# Patient Record
Sex: Female | Born: 1960 | Race: Black or African American | Hispanic: No | Marital: Married | Smoking: Never smoker
Health system: Southern US, Community
[De-identification: ages and names within clinical notes are randomized; demographics above are authoritative.]

## PROBLEM LIST (undated history)

## (undated) DIAGNOSIS — K219 Gastro-esophageal reflux disease without esophagitis: Secondary | ICD-10-CM

## (undated) DIAGNOSIS — I10 Essential (primary) hypertension: Secondary | ICD-10-CM

## (undated) DIAGNOSIS — D219 Benign neoplasm of connective and other soft tissue, unspecified: Secondary | ICD-10-CM

## (undated) DIAGNOSIS — IMO0001 Reserved for inherently not codable concepts without codable children: Secondary | ICD-10-CM

## (undated) HISTORY — PX: ABLATION: SHX5711

## (undated) HISTORY — DX: Benign neoplasm of connective and other soft tissue, unspecified: D21.9

## (undated) HISTORY — DX: Gastro-esophageal reflux disease without esophagitis: K21.9

## (undated) HISTORY — DX: Reserved for inherently not codable concepts without codable children: IMO0001

---

## 2001-12-14 ENCOUNTER — Other Ambulatory Visit: Admission: RE | Admit: 2001-12-14 | Discharge: 2001-12-14 | Payer: Self-pay | Admitting: Obstetrics and Gynecology

## 2002-07-02 ENCOUNTER — Ambulatory Visit (HOSPITAL_COMMUNITY): Admission: RE | Admit: 2002-07-02 | Discharge: 2002-07-02 | Payer: Self-pay | Admitting: Family Medicine

## 2002-12-24 ENCOUNTER — Other Ambulatory Visit: Admission: RE | Admit: 2002-12-24 | Discharge: 2002-12-24 | Payer: Self-pay | Admitting: Obstetrics and Gynecology

## 2003-04-24 ENCOUNTER — Encounter: Admission: RE | Admit: 2003-04-24 | Discharge: 2003-04-24 | Payer: Self-pay | Admitting: Cardiology

## 2003-04-24 ENCOUNTER — Encounter: Payer: Self-pay | Admitting: Cardiology

## 2003-07-15 ENCOUNTER — Ambulatory Visit (HOSPITAL_COMMUNITY): Admission: RE | Admit: 2003-07-15 | Discharge: 2003-07-15 | Payer: Self-pay | Admitting: Cardiology

## 2003-07-15 ENCOUNTER — Encounter: Payer: Self-pay | Admitting: Cardiology

## 2005-05-20 ENCOUNTER — Ambulatory Visit (HOSPITAL_COMMUNITY): Admission: RE | Admit: 2005-05-20 | Discharge: 2005-05-20 | Payer: Self-pay | Admitting: Cardiology

## 2005-05-26 ENCOUNTER — Encounter: Admission: RE | Admit: 2005-05-26 | Discharge: 2005-05-26 | Payer: Self-pay | Admitting: Cardiology

## 2006-02-21 ENCOUNTER — Emergency Department (HOSPITAL_COMMUNITY): Admission: EM | Admit: 2006-02-21 | Discharge: 2006-02-21 | Payer: Self-pay | Admitting: Family Medicine

## 2006-06-01 ENCOUNTER — Ambulatory Visit (HOSPITAL_COMMUNITY): Admission: RE | Admit: 2006-06-01 | Discharge: 2006-06-01 | Payer: Self-pay | Admitting: Cardiology

## 2008-02-05 ENCOUNTER — Emergency Department (HOSPITAL_COMMUNITY): Admission: EM | Admit: 2008-02-05 | Discharge: 2008-02-05 | Payer: Self-pay | Admitting: Emergency Medicine

## 2008-04-10 ENCOUNTER — Ambulatory Visit: Payer: Self-pay | Admitting: Gynecology

## 2008-04-10 ENCOUNTER — Other Ambulatory Visit: Admission: RE | Admit: 2008-04-10 | Discharge: 2008-04-10 | Payer: Self-pay | Admitting: Obstetrics and Gynecology

## 2008-04-24 ENCOUNTER — Ambulatory Visit: Payer: Self-pay | Admitting: Obstetrics & Gynecology

## 2008-05-01 ENCOUNTER — Ambulatory Visit: Payer: Self-pay | Admitting: Obstetrics & Gynecology

## 2008-05-30 ENCOUNTER — Ambulatory Visit: Payer: Self-pay | Admitting: Obstetrics and Gynecology

## 2008-06-03 ENCOUNTER — Ambulatory Visit (HOSPITAL_COMMUNITY): Admission: RE | Admit: 2008-06-03 | Discharge: 2008-06-03 | Payer: Self-pay | Admitting: Cardiology

## 2008-06-23 ENCOUNTER — Inpatient Hospital Stay (HOSPITAL_COMMUNITY): Admission: EM | Admit: 2008-06-23 | Discharge: 2008-06-24 | Payer: Self-pay | Admitting: Emergency Medicine

## 2008-06-27 ENCOUNTER — Ambulatory Visit: Payer: Self-pay | Admitting: Obstetrics & Gynecology

## 2008-08-01 ENCOUNTER — Ambulatory Visit: Payer: Self-pay | Admitting: Obstetrics & Gynecology

## 2008-08-13 ENCOUNTER — Ambulatory Visit: Payer: Self-pay | Admitting: Obstetrics & Gynecology

## 2008-08-29 ENCOUNTER — Ambulatory Visit: Payer: Self-pay | Admitting: Obstetrics & Gynecology

## 2008-09-26 ENCOUNTER — Ambulatory Visit: Payer: Self-pay | Admitting: Obstetrics & Gynecology

## 2008-10-14 ENCOUNTER — Ambulatory Visit (HOSPITAL_COMMUNITY): Admission: RE | Admit: 2008-10-14 | Discharge: 2008-10-14 | Payer: Self-pay | Admitting: Obstetrics & Gynecology

## 2008-10-14 ENCOUNTER — Ambulatory Visit: Payer: Self-pay | Admitting: Obstetrics & Gynecology

## 2008-10-14 ENCOUNTER — Encounter: Payer: Self-pay | Admitting: Obstetrics & Gynecology

## 2008-10-24 ENCOUNTER — Ambulatory Visit: Payer: Self-pay | Admitting: Obstetrics & Gynecology

## 2009-01-19 ENCOUNTER — Emergency Department (HOSPITAL_COMMUNITY): Admission: EM | Admit: 2009-01-19 | Discharge: 2009-01-19 | Payer: Self-pay | Admitting: Emergency Medicine

## 2009-07-03 ENCOUNTER — Emergency Department (HOSPITAL_COMMUNITY): Admission: EM | Admit: 2009-07-03 | Discharge: 2009-07-03 | Payer: Self-pay | Admitting: Emergency Medicine

## 2009-08-31 IMAGING — CR DG CHEST 1V PORT
1 series · 1 of 1 positions shown · non-contrast
Comparison: None

CLINICAL DATA: Chest pain.  Short of breath.

PORTABLE CHEST - 1 VIEW

[view not recorded]
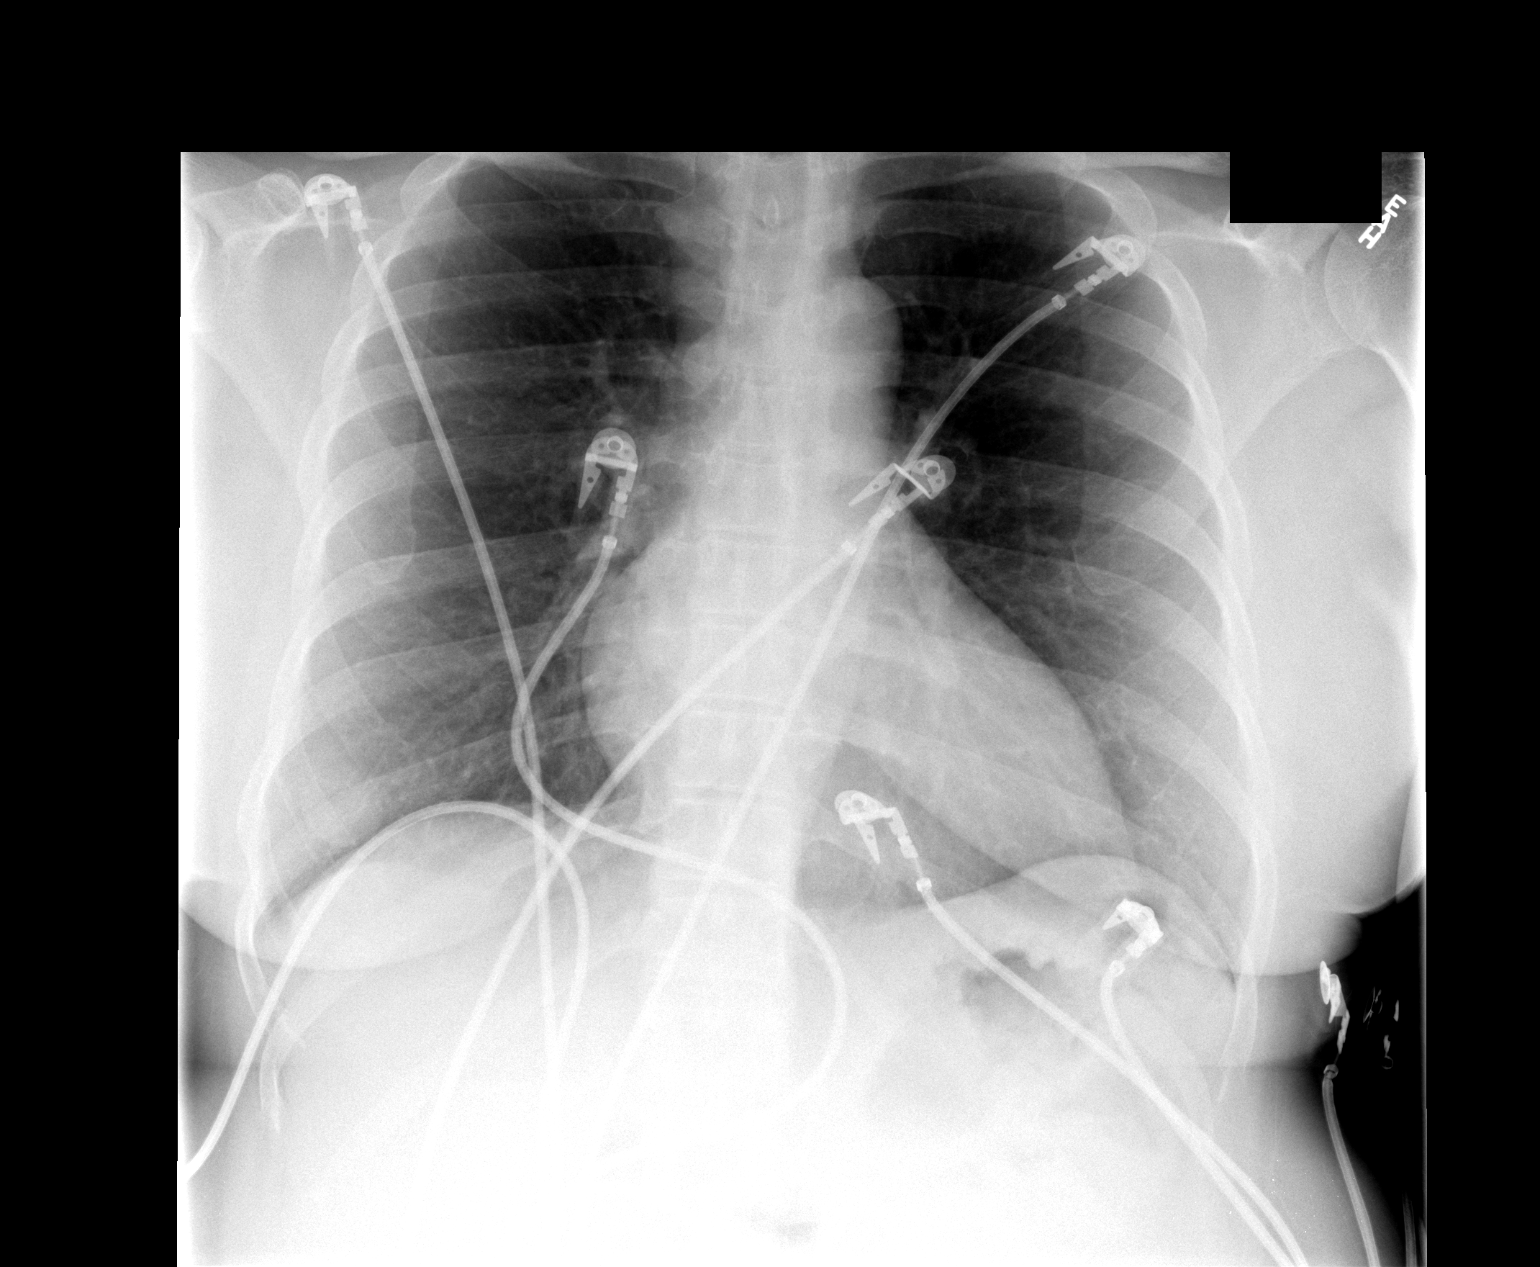

[1 of 1 positions shown; findings below may reference images not displayed]

FINDINGS: Artifact overlies the chest.  Heart size is normal.  The
vascularity is normal.  Lungs are clear.  No effusions.
IMPRESSION: No active disease

## 2009-12-03 ENCOUNTER — Ambulatory Visit (HOSPITAL_COMMUNITY): Admission: RE | Admit: 2009-12-03 | Discharge: 2009-12-03 | Payer: Self-pay | Admitting: Cardiology

## 2010-12-23 ENCOUNTER — Ambulatory Visit (HOSPITAL_COMMUNITY)
Admission: RE | Admit: 2010-12-23 | Discharge: 2010-12-23 | Payer: Self-pay | Source: Home / Self Care | Attending: Cardiology | Admitting: Cardiology

## 2011-04-26 NOTE — Group Therapy Note (Signed)
NAMEMYLIN, GIGNAC NO.:  [id number]   MEDICAL RECORD NO.:  [id number]          PATIENT TYPE:  WOC   LOCATION:  [facility]                   FACILITY:  [facility]   PHYSICIAN:  Elsie Lincoln, MD      DATE OF BIRTH:  1961-08-03   DATE OF SERVICE:  08/13/2008                                  CLINIC NOTE   The patient is a 50 year old nulliparous female with history of  menorrhagia with severe anemia.  The patient has had a negative  endometrial biopsy this year on April 10, 2008.  She has been placed on  Depo Lupron for 6 months.  She still is bleeding, sometimes spotting and  sometimes heavy due to Depo Lupron.  We discussed an IUD, continue  Lupron with antibiotics therapy, endometrial ablation and hysterectomy.  The patient opted for endometrial ablation.  We will schedule this.  She  needs to meet with financial assistance to determine sliding scale and  payment schedule.  This is a medically necessary procedure.  She has  failed medical management.  We have done a complete history in April  with major medical problems including hypertension.  The patient is to  continue her Depo Lupron until the surgery and we will do a D&C  hysteroscopy, hydrothermal ablation.  The patient was consented for  procedure with the risks include but not limited to bleeding, infection,  damage to the back of the uterus and also burning of the vagina.  The  patient will be scheduled at earliest possible date.           ______________________________  Elsie Lincoln, MD     KL/MEDQ  D:  08/13/2008  T:  08/13/2008  Job:  [id number]

## 2011-04-26 NOTE — Group Therapy Note (Signed)
Laura Oneal, Laura Oneal NO.:  [id number]   MEDICAL RECORD NO.:  [id number]          PATIENT TYPE:  WOC   LOCATION:  [facility]                   FACILITY:  [facility]   PHYSICIAN:  Elsie Lincoln, MD      DATE OF BIRTH:  08-16-1961   DATE OF SERVICE:  10/24/2008                                  CLINIC NOTE   The patient is a 50 year old female who underwent D&C, hysteroscopy, and  hydrothermal ablation approximately 2 weeks ago on October 14, 2008.  The patient has had no bleeding, minimal discharge, and is very happy  with the procedure.  She had no pain and can resume sexual intercourse  at this time.  The patient understands that there is a risk that she may  develop periods later.  However, they will not be as heavy as they were.  If they do continue, then we could possibly go back on Lupron, use Depo-  Provera, or even possibly hysterectomy as necessary.   PHYSICAL EXAMINATION:  VITAL SIGNS:  Temperature 97.2, pulse 70, blood  pressure 147/95 with a repeat of 125/79, weight 184.2, and height 5 feet  1-1/2 inches.  GENERAL:  Well nourished, well developed in no apparent distress.  HEENT:  Normocephalic and atraumatic.  ABDOMEN:  Soft and nontender.  No rebound or guarding.  GENITALIA:  Tanner V.  Vagina pink, normal rugae.  Cervix is closed,  nontender.  Uterus is slightly enlarged, midline, nontender. Adnexa, no  masses and nontender.   ASSESSMENT/PLAN:  A 50 year old female status post dilation and  curettage, hysteroscopy, and hydrothermal ablation, doing well.  The  patient is to come back in January for yearly exam.           ______________________________  Elsie Lincoln, MD     KL/MEDQ  D:  10/24/2008  T:  10/25/2008  Job:  [id number]

## 2011-04-26 NOTE — Op Note (Signed)
Laura Oneal, Laura Oneal              ACCOUNT NO.:  [id number]   MEDICAL RECORD NO.:  [id number]          PATIENT TYPE:  AMB   LOCATION:  [facility]                           [facility]   PHYSICIAN:  Lesly Dukes, M.D. DATE OF BIRTH:  1961-01-19   DATE OF PROCEDURE:  10/14/2008  DATE OF DISCHARGE:  10/14/2008                               OPERATIVE REPORT   PREOPERATIVE DIAGNOSES:  A 50 year old female with menorrhagia, fibroid,  and iron-deficiency anemia.   POSTOPERATIVE DIAGNOSES:  A 50 year old female with menorrhagia,  fibroid, and iron-deficiency anemia.   PROCEDURES:  Dilation and curettage, hysteroscopy, and hydrothermal  ablation.   SURGEON:  Lesly Dukes, MD   ANESTHESIA:  General.   FINDINGS:  Slightly enlarged uterus with a small mucosal fibroid that  was well dilated.   SPECIMENS:  Endometrial curettings to Pathology.   ESTIMATED BLOOD LOSS:  Minimal.   COMPLICATIONS:  None.   PROCEDURE IN DETAIL:  After informed consent was obtained, the patient  was taken to the operating room where general anesthesia was induced.  The patient was placed in a dorsal lithotomy position.  The bladder was  emptied.  The speculum was placed in the vagina.  The anterior lip of  cervix was grasped with a single-tooth tenaculum.  The cervical os was  dilated to a #8 Hegar.  Sharp curettage was performed and these products  were sent to Pathology.  The hysteroscope was introduced and then using  manufacturer's specifications, the endometrium was ablated for 10  minutes with an appropriate 1 minute cool down.  Findings were noted as  above.  All instruments were then removed from the patient's vagina and  there was good hemostasis at the cervix.  The patient tolerated the  procedure well.  The sponge, lap, instrument, and needle count were  correct x2, and the patient left the room in stable condition.      Lesly Dukes, M.D.  Electronically Signed     KHL/MEDQ  D:   10/23/2008  T:  10/24/2008  Job:  [id number]

## 2011-04-26 NOTE — Group Therapy Note (Signed)
Laura Oneal, Laura Oneal NO.:  [id number]   MEDICAL RECORD NO.:  [id number]          PATIENT TYPE:  WOC   LOCATION:  [facility]                   [facility]ACILITY:  [facility]   PHYSICIAN:  Ginger Carne, MD DATE OF BIRTH:  03/24/1961   DATE OF SERVICE:  04/10/2008                                  CLINIC NOTE   REASON FOR VISIT:  Abnormal uterine bleeding with iron-deficiency  anemia.   HISTORY OF PRESENT ILLNESS:  This is a 50 year old G0 who presents at  the referral of [facility] for further evaluation of abnormal  uterine bleeding with significant iron-deficiency anemia.  The patient  reports that over the last 6 months she has developed heavy bleeding  with her periods and clotting and has developed iron-deficiency anemia  as a result with a hemoglobin as low as 7.4 in March.  The patient is  currently on iron and is asymptomatic.  The patient was placed on  DepoLupron and has received 2 total injections, one on February 28, 2008  and a second on March 30, 2008 to try to decrease the bleeding.  She had  pelvic ultrasounds performed at [facility] which revealed a fibroid  uterus and thus, she was referred here for endometrial biopsy.  The  patient does report improvement in her bleeding since receiving the  DepoLupron injections but has not had resolution of her bleeding.   REVIEW OF SYSTEMS:  A 10-system review of systems otherwise negative  except for hot flashes.   PAST MEDICAL HISTORY:  Significant only for:  1. Hypertension.  2. Iron-deficiency anemia.  3. Fibroid uterus, as mentioned above.   OBSTETRICAL/GYNECOLOGIC HISTORY:  The patient is a G0.  She had onset of  menarche at age 72 with regular periods approximately every 30 days.  She has no intermenstrual bleeding.  Her last Pap smear was in January  2009 and was normal.  She has no history of abnormal Pap smears.  Last  mammogram was in May 2007 and normal.   FAMILY HISTORY:  Significant for  diabetes, heart disease, and high blood  pressure.   SOCIAL HISTORY:  The patient denies tobacco, alcohol, or drug use.   PHYSICAL EXAMINATION:  VITAL SIGNS:  Temperature is 97, heart rate 67,  blood pressure 144/88, weight is 175.5 pounds, height is 5 feet 1.5  inches, respiratory rate 18.  GENERAL:  This is a well nourished well developed female in no acute  distress.  HEART:  Regular rate and rhythm.  LUNGS:  Normal work-of-breathing.  ABDOMEN:  Soft and nontender.  GENITOURINARY:  The patient has normal external female genitalia.  Vaginal mucosa is pink and moist without lesions.  Cervix was well  visualized with a groove speculum and slightly deviated to the patient's  left.  The patient has small less than 5-mm  cyst on the inferior aspect  of the cervix at approximately 7 o'clock.  The cervix is otherwise  normal without lesions or discharge.  Uterus was sounded to  approximately 9.5-cm and the __________ endometrial biopsy was obtained  with adequate tissue specimen.  The patient tolerated the procedure well  with minimal blood loss.  Bimanual exam revealed a 10 to 12-week size  nontender uterus.   ASSESSMENT:  This is a 50 year old gravida 0 female with abnormal  uterine bleeding and iron-deficiency anemia as well as uterine fibroids  who presents at the referral of [facility] for  an endometrial biopsy.   PLAN:  1. Abnormal uterine bleeding.  It may be secondary to uterine      fibroids, however, given age endometrial biopsy is indicated to      rule out cancer and was obtained today successfully.  The patient      will return in 2 weeks to discuss pathology results.  2. Iron-deficiency anemia.  Baseline hemoglobin is between 7-8.      Currently the patient is on iron therapy taking at least twice      daily without problems.  She reports compliance with this.  She is      currently asymptomatic.  3. Uterine fibroids.  This may be the source of  the patient's      bleeding, however, will need endometrial biopsy results before      proceeding with additional treatment.   FOLLOWUP APPOINTMENT:  The patient is to follow up in 2 weeks for  results of endometrial biopsy.     ______________________________  Drue Dun, Dr.    ______________________________  Ginger Carne, MD    EE/MEDQ  D:  04/10/2008  T:  04/10/2008  Job:  [id number]

## 2011-08-27 ENCOUNTER — Inpatient Hospital Stay (INDEPENDENT_AMBULATORY_CARE_PROVIDER_SITE_OTHER)
Admission: RE | Admit: 2011-08-27 | Discharge: 2011-08-27 | Disposition: A | Discharge status: Home / Self Care | Payer: Managed Care, Other (non HMO) | Source: Ambulatory Visit | Attending: Family Medicine | Admitting: Family Medicine

## 2011-08-27 DIAGNOSIS — J019 Acute sinusitis, unspecified: Secondary | ICD-10-CM

## 2011-09-08 LAB — POCT I-STAT, CHEM 8
BUN: 9
Chloride: 101
Creatinine, Ser: 0.9
Hemoglobin: 13.6
Potassium: 3.4 — ABNORMAL LOW

## 2011-09-08 LAB — COMPREHENSIVE METABOLIC PANEL
AST: 18
Albumin: 3.4 — ABNORMAL LOW
Alkaline Phosphatase: 63
CO2: 32
Calcium: 9.2
Chloride: 101
GFR calc Af Amer: >60
Glucose, Bld: 90
Sodium: 139
Total Bilirubin: 0.9

## 2011-09-08 LAB — TROPONIN I: Troponin I: <0.01

## 2011-09-08 LAB — CBC
HCT: 37.6
Platelets: 319
RDW: 13.3
WBC: 4.8

## 2011-09-08 LAB — CK TOTAL AND CKMB (NOT AT ARMC)
Relative Index: 1.3
Relative Index: 1.4

## 2011-09-08 LAB — POCT CARDIAC MARKERS
CKMB, poc: 2.2
Myoglobin, poc: 57.1
Operator id: 151321
Troponin i, poc: <0.05

## 2011-09-13 LAB — CBC
MCHC: 32.8
MCV: 83.5
Platelets: 300
RDW: 13.5

## 2012-01-09 ENCOUNTER — Other Ambulatory Visit (HOSPITAL_COMMUNITY): Payer: Self-pay | Admitting: Cardiology

## 2012-01-09 DIAGNOSIS — Z1231 Encounter for screening mammogram for malignant neoplasm of breast: Secondary | ICD-10-CM

## 2012-02-07 ENCOUNTER — Ambulatory Visit (HOSPITAL_COMMUNITY)
Admission: RE | Admit: 2012-02-07 | Discharge: 2012-02-07 | Disposition: A | Discharge status: Home / Self Care | Payer: Managed Care, Other (non HMO) | Source: Ambulatory Visit | Attending: Cardiology | Admitting: Cardiology

## 2012-02-07 DIAGNOSIS — Z1231 Encounter for screening mammogram for malignant neoplasm of breast: Secondary | ICD-10-CM

## 2014-05-06 ENCOUNTER — Other Ambulatory Visit (HOSPITAL_COMMUNITY): Payer: Self-pay | Admitting: Cardiology

## 2014-06-04 ENCOUNTER — Emergency Department (INDEPENDENT_AMBULATORY_CARE_PROVIDER_SITE_OTHER)
Admission: EM | Admit: 2014-06-04 | Discharge: 2014-06-04 | Disposition: A | Discharge status: Home / Self Care | Payer: Self-pay | Source: Home / Self Care | Attending: Family Medicine | Admitting: Family Medicine

## 2014-06-04 ENCOUNTER — Encounter (HOSPITAL_COMMUNITY): Payer: Self-pay | Admitting: Emergency Medicine

## 2014-06-04 DIAGNOSIS — R1013 Epigastric pain: Secondary | ICD-10-CM

## 2014-06-04 DIAGNOSIS — I10 Essential (primary) hypertension: Secondary | ICD-10-CM

## 2014-06-04 HISTORY — DX: Essential (primary) hypertension: I10

## 2014-06-04 LAB — POCT I-STAT, CHEM 8
BUN: 11 mg/dL (ref 6–23)
Calcium, Ion: 1.23 mmol/L (ref 1.12–1.23)
Chloride: 103 mEq/L (ref 96–112)
Creatinine, Ser: 0.9 mg/dL (ref 0.50–1.10)
GLUCOSE: 77 mg/dL (ref 70–99)
HEMATOCRIT: 41 % (ref 36.0–46.0)
HEMOGLOBIN: 13.9 g/dL (ref 12.0–15.0)
POTASSIUM: 3.4 meq/L — AB (ref 3.7–5.3)
Sodium: 143 mEq/L (ref 137–147)
TCO2: 26 mmol/L (ref 0–100)

## 2014-06-04 LAB — CBC
HEMATOCRIT: 38 % (ref 36.0–46.0)
HEMOGLOBIN: 11.9 g/dL — AB (ref 12.0–15.0)
MCH: 25 pg — AB (ref 26.0–34.0)
MCHC: 31.3 g/dL (ref 30.0–36.0)
MCV: 79.8 fL (ref 78.0–100.0)
Platelets: 300 10*3/uL (ref 150–400)
RBC: 4.76 MIL/uL (ref 3.87–5.11)
RDW: 14.6 % (ref 11.5–15.5)
WBC: 5.7 10*3/uL (ref 4.0–10.5)

## 2014-06-04 LAB — COMPREHENSIVE METABOLIC PANEL
ALT: 14 U/L (ref 0–35)
AST: 19 U/L (ref 0–37)
Albumin: 3.7 g/dL (ref 3.5–5.2)
Alkaline Phosphatase: 85 U/L (ref 39–117)
BILIRUBIN TOTAL: 0.5 mg/dL (ref 0.3–1.2)
BUN: 12 mg/dL (ref 6–23)
CHLORIDE: 103 meq/L (ref 96–112)
CO2: 26 meq/L (ref 19–32)
CREATININE: 0.77 mg/dL (ref 0.50–1.10)
Calcium: 9.3 mg/dL (ref 8.4–10.5)
GLUCOSE: 75 mg/dL (ref 70–99)
Potassium: 3.5 mEq/L — ABNORMAL LOW (ref 3.7–5.3)
Sodium: 142 mEq/L (ref 137–147)
Total Protein: 7.9 g/dL (ref 6.0–8.3)

## 2014-06-04 LAB — LIPASE, BLOOD: LIPASE: 37 U/L (ref 11–59)

## 2014-06-04 LAB — POCT H PYLORI SCREEN: H. PYLORI SCREEN, POC: NEGATIVE

## 2014-06-04 MED ORDER — GI COCKTAIL ~~LOC~~
30.0000 mL | Freq: Once | ORAL | Status: AC
  Administered 2014-06-04: 30 mL via ORAL

## 2014-06-04 MED ORDER — GI COCKTAIL ~~LOC~~
ORAL | Status: AC
  Filled 2014-06-04: qty 30

## 2014-06-04 MED ORDER — SUCRALFATE 1 G PO TABS: 1.0000 g | ORAL_TABLET | Freq: Three times a day (TID) | ORAL | Status: DC

## 2014-06-04 MED ORDER — HYDROCHLOROTHIAZIDE 25 MG PO TABS: 25.0000 mg | ORAL_TABLET | Freq: Every day | ORAL | Status: DC

## 2014-06-04 MED ORDER — OMEPRAZOLE 40 MG PO CPDR: 40.0000 mg | DELAYED_RELEASE_CAPSULE | Freq: Every day | ORAL | Status: DC

## 2014-06-04 NOTE — ED Provider Notes (Signed)
Laura Oneal is a 53 y.o. female who presents to Urgent Care today for epigastric abdominal pain. Symptoms present for the last 3 weeks. Patient notes epigastric right and left abdominal pain. Pain is worse after eating. He the pain is associated with gas production and constipation. She has tried Zantac which helps. She denies any chest pain palpitations or shortness of breath. She feels well otherwise with no nausea vomiting or diarrhea. She denies any urinary symptoms. She has not had similar pain. Her past surgical history significant for a hysterectomy. She still has her gallbladder and appendix.  She is not currently taking medications for blood pressure.   Past Medical History  Diagnosis Date  . Hypertension    History  Substance Use Topics  . Smoking status: Never Smoker   . Smokeless tobacco: Not on file  . Alcohol Use: No   ROS as above Medications: No current facility-administered medications for this encounter.   Current Outpatient Prescriptions  Medication Sig Dispense Refill  . hydrochlorothiazide (HYDRODIURIL) 25 MG tablet Take 1 tablet (25 mg total) by mouth daily.  30 tablet  2  . omeprazole (PRILOSEC) 40 MG capsule Take 1 capsule (40 mg total) by mouth daily.  30 capsule  1  . sucralfate (CARAFATE) 1 G tablet Take 1 tablet (1 g total) by mouth 4 (four) times daily -  with meals and at bedtime.  60 tablet  0    Exam:  BP 201/103  Pulse 69  Temp(Src) 98 F (36.7 C) (Oral)  Resp 16  SpO2 100% Gen: Well NAD HEENT: EOMI,  MMM Lungs: Normal work of breathing. CTABL Heart: RRR no MRG Abd: NABS, Soft. Mildly tender palpation upper right and upper left quadrants without rebound or guarding. No CV angle tenderness to percussion. Exts: Brisk capillary refill, warm and well perfused.   Twelve-lead EKG shows normal sinus rhythm at 67 beats per minute with no significant abnormalities.  Patient was given a GI cocktail and had significant improvement in  symptoms.  Results for orders placed during the hospital encounter of 06/04/14 (from the past 24 hour(s))  CBC     Status: Abnormal   Collection Time    06/04/14  5:30 PM      Result Value Ref Range   WBC 5.7  4.0 - 10.5 K/uL   RBC 4.76  3.87 - 5.11 MIL/uL   Hemoglobin 11.9 (*) 12.0 - 15.0 g/dL   HCT 38.0  36.0 - 46.0 %   MCV 79.8  78.0 - 100.0 fL   MCH 25.0 (*) 26.0 - 34.0 pg   MCHC 31.3  30.0 - 36.0 g/dL   RDW 14.6  11.5 - 15.5 %   Platelets 300  150 - 400 K/uL  LIPASE, BLOOD     Status: None   Collection Time    06/04/14  5:30 PM      Result Value Ref Range   Lipase 37  11 - 59 U/L  COMPREHENSIVE METABOLIC PANEL     Status: Abnormal   Collection Time    06/04/14  5:30 PM      Result Value Ref Range   Sodium 142  137 - 147 mEq/L   Potassium 3.5 (*) 3.7 - 5.3 mEq/L   Chloride 103  96 - 112 mEq/L   CO2 26  19 - 32 mEq/L   Glucose, Bld 75  70 - 99 mg/dL   BUN 12  6 - 23 mg/dL   Creatinine, Ser 0.77  0.50 -  1.10 mg/dL   Calcium 9.3  8.4 - 10.5 mg/dL   Total Protein 7.9  6.0 - 8.3 g/dL   Albumin 3.7  3.5 - 5.2 g/dL   AST 19  0 - 37 U/L   ALT 14  0 - 35 U/L   Alkaline Phosphatase 85  39 - 117 U/L   Total Bilirubin 0.5  0.3 - 1.2 mg/dL   GFR calc non Af Amer >90  >90 mL/min   GFR calc Af Amer >90  >90 mL/min  POCT I-STAT, CHEM 8     Status: Abnormal   Collection Time    06/04/14  5:43 PM      Result Value Ref Range   Sodium 143  137 - 147 mEq/L   Potassium 3.4 (*) 3.7 - 5.3 mEq/L   Chloride 103  96 - 112 mEq/L   BUN 11  6 - 23 mg/dL   Creatinine, Ser 0.90  0.50 - 1.10 mg/dL   Glucose, Bld 77  70 - 99 mg/dL   Calcium, Ion 1.23  1.12 - 1.23 mmol/L   TCO2 26  0 - 100 mmol/L   Hemoglobin 13.9  12.0 - 15.0 g/dL   HCT 41.0  36.0 - 46.0 %  POCT H PYLORI SCREEN     Status: None   Collection Time    06/04/14  5:46 PM      Result Value Ref Range   H. PYLORI SCREEN, POC NEGATIVE  NEGATIVE   No results found.  Assessment and Plan: 53 y.o. female with  Abdominal pain.  Likely gastritis. Plan to treat with omeprazole and Carafate.  Additionally patient has significantly elevated blood pressure. Will treat with hydrochlorothiazide. Followup with primary care provider.  Discussed warning signs or symptoms. Please see discharge instructions. Patient expresses understanding.    Gregor Hams, MD 06/04/14 [phone]

## 2014-06-04 NOTE — ED Notes (Signed)
C/o  Upper abdominal pain on the right side.  Gas.  Burping a lot.  Recent increase in thirst.  Constipaition.  Last BM yesterday morning.  states "very little and hard to pass".   Mild relief zantac.   Denies chest pain and sob.

## 2014-06-04 NOTE — Discharge Instructions (Signed)
Thank you for coming in today. Take the medications as directed.  If your belly pain worsens, or you have high fever, bad vomiting, blood in your stool or black tarry stool go to the Emergency Room.    Abdominal Pain, Women Abdominal (stomach, pelvic, or belly) pain can be caused by many things. It is important to tell your doctor:  The location of the pain.  Does it come and go or is it present all the time?  Are there things that start the pain (eating certain foods, exercise)?  Are there other symptoms associated with the pain (fever, nausea, vomiting, diarrhea)? All of this is helpful to know when trying to find the cause of the pain. CAUSES   Stomach: virus or bacteria infection, or ulcer.  Intestine: appendicitis (inflamed appendix), regional ileitis (Crohn's disease), ulcerative colitis (inflamed colon), irritable bowel syndrome, diverticulitis (inflamed diverticulum of the colon), or cancer of the stomach or intestine.  Gallbladder disease or stones in the gallbladder.  Kidney disease, kidney stones, or infection.  Pancreas infection or cancer.  Fibromyalgia (pain disorder).  Diseases of the female organs:  Uterus: fibroid (non-cancerous) tumors or infection.  Fallopian tubes: infection or tubal pregnancy.  Ovary: cysts or tumors.  Pelvic adhesions (scar tissue).  Endometriosis (uterus lining tissue growing in the pelvis and on the pelvic organs).  Pelvic congestion syndrome (female organs filling up with blood just before the menstrual period).  Pain with the menstrual period.  Pain with ovulation (producing an egg).  Pain with an IUD (intrauterine device, birth control) in the uterus.  Cancer of the female organs.  Functional pain (pain not caused by a disease, may improve without treatment).  Psychological pain.  Depression. DIAGNOSIS  Your doctor will decide the seriousness of your pain by doing an examination.  Blood  tests.  X-rays.  Ultrasound.  CT scan (computed tomography, special type of X-ray).  MRI (magnetic resonance imaging).  Cultures, for infection.  Barium enema (dye inserted in the large intestine, to better view it with X-rays).  Colonoscopy (looking in intestine with a lighted tube).  Laparoscopy (minor surgery, looking in abdomen with a lighted tube).  Major abdominal exploratory surgery (looking in abdomen with a large incision). TREATMENT  The treatment will depend on the cause of the pain.   Many cases can be observed and treated at home.  Over-the-counter medicines recommended by your caregiver.  Prescription medicine.  Antibiotics, for infection.  Birth control pills, for painful periods or for ovulation pain.  Hormone treatment, for endometriosis.  Nerve blocking injections.  Physical therapy.  Antidepressants.  Counseling with a psychologist or psychiatrist.  Minor or major surgery. HOME CARE INSTRUCTIONS   Do not take laxatives, unless directed by your caregiver.  Take over-the-counter pain medicine only if ordered by your caregiver. Do not take aspirin because it can cause an upset stomach or bleeding.  Try a clear liquid diet (broth or water) as ordered by your caregiver. Slowly move to a bland diet, as tolerated, if the pain is related to the stomach or intestine.  Have a thermometer and take your temperature several times a day, and record it.  Bed rest and sleep, if it helps the pain.  Avoid sexual intercourse, if it causes pain.  Avoid stressful situations.  Keep your follow-up appointments and tests, as your caregiver orders.  If the pain does not go away with medicine or surgery, you may try:  Acupuncture.  Relaxation exercises (yoga, meditation).  Group therapy.  Counseling.  SEEK MEDICAL CARE IF:   You notice certain foods cause stomach pain.  Your home care treatment is not helping your pain.  You need stronger pain  medicine.  You want your IUD removed.  You feel faint or lightheaded.  You develop nausea and vomiting.  You develop a rash.  You are having side effects or an allergy to your medicine. SEEK IMMEDIATE MEDICAL CARE IF:   Your pain does not go away or gets worse.  You have a fever.  Your pain is felt only in portions of the abdomen. The right side could possibly be appendicitis. The left lower portion of the abdomen could be colitis or diverticulitis.  You are passing blood in your stools (bright red or black tarry stools, with or without vomiting).  You have blood in your urine.  You develop chills, with or without a fever.  You pass out. MAKE SURE YOU:   Understand these instructions.  Will watch your condition.  Will get help right away if you are not doing well or get worse. Document Released: 09/25/2007 Document Revised: 02/20/2012 Document Reviewed: 10/15/2009 [facility] Patient Information 2015 [place], [place]. This information is not intended to replace advice given to you by your health care provider. Make sure you discuss any questions you have with your health care provider.   Hypertension Hypertension, commonly called high blood pressure, is when the force of blood pumping through your arteries is too strong. Your arteries are the blood vessels that carry blood from your heart throughout your body. A blood pressure reading consists of a higher number over a lower number, such as 110/72. The higher number (systolic) is the pressure inside your arteries when your heart pumps. The lower number (diastolic) is the pressure inside your arteries when your heart relaxes. Ideally you want your blood pressure below 120/80. Hypertension forces your heart to work harder to pump blood. Your arteries may become narrow or stiff. Having hypertension puts you at risk for heart disease, stroke, and other problems.  RISK FACTORS Some risk factors for high blood pressure are  controllable. Others are not.  Risk factors you cannot control include:   Race. You may be at higher risk if you are African American.  Age. Risk increases with age.  Gender. Men are at higher risk than women before age 58 years. After age 64, women are at higher risk than men. Risk factors you can control include:  Not getting enough exercise or physical activity.  Being overweight.  Getting too much fat, sugar, calories, or salt in your diet.  Drinking too much alcohol. SIGNS AND SYMPTOMS Hypertension does not usually cause signs or symptoms. Extremely high blood pressure (hypertensive crisis) may cause headache, anxiety, shortness of breath, and nosebleed. DIAGNOSIS  To check if you have hypertension, your health care provider will measure your blood pressure while you are seated, with your arm held at the level of your heart. It should be measured at least twice using the same arm. Certain conditions can cause a difference in blood pressure between your right and left arms. A blood pressure reading that is higher than normal on one occasion does not mean that you need treatment. If one blood pressure reading is high, ask your health care provider about having it checked again. TREATMENT  Treating high blood pressure includes making lifestyle changes and possibly taking medication. Living a healthy lifestyle can help lower high blood pressure. You may need to change some of your habits. Lifestyle changes may  include:  Following the DASH diet. This diet is high in fruits, vegetables, and whole grains. It is low in salt, red meat, and added sugars.  Getting at least 2 1/2 hours of brisk physical activity every week.  Losing weight if necessary.  Not smoking.  Limiting alcoholic beverages.  Learning ways to reduce stress. If lifestyle changes are not enough to get your blood pressure under control, your health care provider may prescribe medicine. You may need to take more than  one. Work closely with your health care provider to understand the risks and benefits. HOME CARE INSTRUCTIONS  Have your blood pressure rechecked as directed by your health care provider.   Only take medicine as directed by your health care provider. Follow the directions carefully. Blood pressure medicines must be taken as prescribed. The medicine does not work as well when you skip doses. Skipping doses also puts you at risk for problems.   Do not smoke.   Monitor your blood pressure at home as directed by your health care provider. SEEK MEDICAL CARE IF:   You think you are having a reaction to medicines taken.  You have recurrent headaches or feel dizzy.  You have swelling in your ankles.  You have trouble with your vision. SEEK IMMEDIATE MEDICAL CARE IF:  You develop a severe headache or confusion.  You have unusual weakness, numbness, or feel faint.  You have severe chest or abdominal pain.  You vomit repeatedly.  You have trouble breathing. MAKE SURE YOU:   Understand these instructions.  Will watch your condition.  Will get help right away if you are not doing well or get worse. Document Released: 11/28/2005 Document Revised: 12/03/2013 Document Reviewed: 09/20/2013 [facility] Patient Information 2015 [place], [place]. This information is not intended to replace advice given to you by your health care provider. Make sure you discuss any questions you have with your health care provider.

## 2014-10-15 ENCOUNTER — Other Ambulatory Visit (HOSPITAL_COMMUNITY): Payer: Self-pay | Admitting: Cardiology

## 2014-10-15 ENCOUNTER — Other Ambulatory Visit: Payer: Self-pay | Admitting: Cardiology

## 2014-10-15 DIAGNOSIS — R1310 Dysphagia, unspecified: Secondary | ICD-10-CM

## 2014-10-15 DIAGNOSIS — R1011 Right upper quadrant pain: Secondary | ICD-10-CM

## 2014-10-24 ENCOUNTER — Ambulatory Visit
Admission: RE | Admit: 2014-10-24 | Discharge: 2014-10-24 | Disposition: A | Discharge status: Home / Self Care | Payer: Self-pay | Source: Ambulatory Visit | Attending: Cardiology | Admitting: Cardiology

## 2014-10-24 ENCOUNTER — Ambulatory Visit
Admission: RE | Admit: 2014-10-24 | Discharge: 2014-10-24 | Disposition: A | Discharge status: Home / Self Care | Payer: No Typology Code available for payment source | Source: Ambulatory Visit | Attending: Cardiology | Admitting: Cardiology

## 2014-10-24 DIAGNOSIS — R1011 Right upper quadrant pain: Secondary | ICD-10-CM

## 2014-10-24 DIAGNOSIS — R1310 Dysphagia, unspecified: Secondary | ICD-10-CM

## 2014-11-14 ENCOUNTER — Other Ambulatory Visit (HOSPITAL_COMMUNITY): Payer: Self-pay | Admitting: Cardiology

## 2014-11-14 DIAGNOSIS — Z1231 Encounter for screening mammogram for malignant neoplasm of breast: Secondary | ICD-10-CM

## 2014-11-19 ENCOUNTER — Ambulatory Visit (HOSPITAL_COMMUNITY): Payer: Self-pay | Attending: Cardiology

## 2015-03-09 ENCOUNTER — Emergency Department (INDEPENDENT_AMBULATORY_CARE_PROVIDER_SITE_OTHER)
Admission: EM | Admit: 2015-03-09 | Discharge: 2015-03-09 | Disposition: A | Discharge status: Home / Self Care | Payer: Self-pay | Source: Home / Self Care | Attending: Emergency Medicine | Admitting: Emergency Medicine

## 2015-03-09 ENCOUNTER — Encounter (HOSPITAL_COMMUNITY): Payer: Self-pay | Admitting: Emergency Medicine

## 2015-03-09 DIAGNOSIS — L03012 Cellulitis of left finger: Secondary | ICD-10-CM

## 2015-03-09 MED ORDER — CEPHALEXIN 500 MG PO CAPS: 500.0000 mg | ORAL_CAPSULE | Freq: Four times a day (QID) | ORAL | Status: DC

## 2015-03-09 NOTE — ED Provider Notes (Signed)
CSN: [id number]     Arrival date & time 03/09/15  0906 History   First MD Initiated Contact with Patient 03/09/15 [phone]     Chief Complaint  Patient presents with  . Hand Problem   (Consider location/radiation/quality/duration/timing/severity/associated sxs/prior Treatment) HPI She is a 54 year old woman here for evaluation of finger pain. She states she trimmed her cuticle or a hangnail last week and in the finger became red and swollen. It has been throbbing and very painful the last day or 2. No drainage. No fevers.  Past Medical History  Diagnosis Date  . Hypertension    History reviewed. No pertinent past surgical history. History reviewed. No pertinent family history. History  Substance Use Topics  . Smoking status: Never Smoker   . Smokeless tobacco: Not on file  . Alcohol Use: No   OB History    No data available     Review of Systems As in history of present illness Allergies  Review of patient's allergies indicates no known allergies.  Home Medications   Prior to Admission medications   Medication Sig Start Date End Date Taking? Authorizing Provider  hydrochlorothiazide (HYDRODIURIL) 25 MG tablet Take 1 tablet (25 mg total) by mouth daily. 06/04/14  Yes Gregor Hams, MD  cephALEXin (KEFLEX) 500 MG capsule Take 1 capsule (500 mg total) by mouth 4 (four) times daily. 03/09/15   Melony Overly, MD  omeprazole (PRILOSEC) 40 MG capsule Take 1 capsule (40 mg total) by mouth daily. 06/04/14   Gregor Hams, MD  sucralfate (CARAFATE) 1 G tablet Take 1 tablet (1 g total) by mouth 4 (four) times daily -  with meals and at bedtime. 06/04/14   Gregor Hams, MD   BP 191/111 mmHg  Pulse 62  Temp(Src) 98.3 F (36.8 C) (Oral)  Resp 17  SpO2 98% Physical Exam  Constitutional: She is oriented to person, place, and time. She appears well-developed and well-nourished. No distress.  Cardiovascular: Normal rate.   Pulmonary/Chest: Effort normal.  Neurological: She is alert and oriented  to person, place, and time.  Skin:  Erythema and fluctuance around the cuticle of her left middle finger.    ED Course  INCISION AND DRAINAGE Date/Time: 03/09/2015 10:23 AM Performed by: Melony Overly Authorized by: Melony Overly Consent: Verbal consent obtained. Risks and benefits: risks, benefits and alternatives were discussed Consent given by: patient Patient understanding: patient states understanding of the procedure being performed Patient identity confirmed: verbally with patient Time out: Immediately prior to procedure a "time out" was called to verify the correct patient, procedure, equipment, support staff and site/side marked as required. Type: abscess Body area: upper extremity Location details: left long finger Anesthesia: digital block Local anesthetic: lidocaine 2% without epinephrine Anesthetic total: 2 ml Scalpel size: 11 Incision type: single straight Complexity: simple Drainage: purulent Drainage amount: moderate Patient tolerance: Patient tolerated the procedure well with no immediate complications   (including critical care time) Labs Review Labs Reviewed - No data to display  Imaging Review No results found.   MDM   1. Paronychia of finger of left hand    I&D performed today. Recommended warm soaks for the next few days. Keflex for 7 days. Follow-up as needed.    Melony Overly, MD 03/09/15 1024

## 2015-03-09 NOTE — ED Notes (Signed)
Pt states that she had a hang nail on her finger and that she clipped with a nail clipper she believes she cut her finger

## 2015-03-09 NOTE — Discharge Instructions (Signed)
You have an infection around your nail bed. We drained the pus today. Do warm soaks 2-3 times a day. Wash the finger with soap and water twice a day. Take Keflex one pill 4 times a day for 7 days. Follow-up as needed.

## 2015-09-08 ENCOUNTER — Other Ambulatory Visit: Payer: Self-pay

## 2015-09-08 DIAGNOSIS — Z1231 Encounter for screening mammogram for malignant neoplasm of breast: Secondary | ICD-10-CM

## 2015-09-09 ENCOUNTER — Ambulatory Visit
Admission: RE | Admit: 2015-09-09 | Discharge: 2015-09-09 | Disposition: A | Discharge status: Home / Self Care | Payer: 59 | Source: Ambulatory Visit

## 2015-09-09 DIAGNOSIS — Z1231 Encounter for screening mammogram for malignant neoplasm of breast: Secondary | ICD-10-CM

## 2015-10-02 ENCOUNTER — Other Ambulatory Visit: Payer: Self-pay

## 2015-10-20 ENCOUNTER — Other Ambulatory Visit: Payer: Self-pay | Admitting: Gastroenterology

## 2015-12-09 ENCOUNTER — Encounter (HOSPITAL_COMMUNITY): Payer: Self-pay | Admitting: Emergency Medicine

## 2015-12-09 ENCOUNTER — Emergency Department (INDEPENDENT_AMBULATORY_CARE_PROVIDER_SITE_OTHER)
Admission: EM | Admit: 2015-12-09 | Discharge: 2015-12-09 | Disposition: A | Discharge status: Home / Self Care | Payer: 59 | Source: Home / Self Care | Attending: Emergency Medicine | Admitting: Emergency Medicine

## 2015-12-09 DIAGNOSIS — T7840XA Allergy, unspecified, initial encounter: Secondary | ICD-10-CM | POA: Diagnosis not present

## 2015-12-09 MED ORDER — EPINEPHRINE 0.3 MG/0.3ML IJ SOAJ: 0.3000 mg | Freq: Once | INTRAMUSCULAR | Status: AC

## 2015-12-09 MED ORDER — METHYLPREDNISOLONE SODIUM SUCC 125 MG IJ SOLR
125.0000 mg | Freq: Once | INTRAMUSCULAR | Status: AC
  Administered 2015-12-09: 125 mg via INTRAMUSCULAR

## 2015-12-09 MED ORDER — METHYLPREDNISOLONE SODIUM SUCC 125 MG IJ SOLR
INTRAMUSCULAR | Status: AC
  Filled 2015-12-09: qty 2

## 2015-12-09 MED ORDER — PREDNISONE 50 MG PO TABS: ORAL_TABLET | ORAL | Status: DC

## 2015-12-09 MED ORDER — CETIRIZINE HCL 10 MG PO TABS: 10.0000 mg | ORAL_TABLET | Freq: Every day | ORAL | Status: DC

## 2015-12-09 NOTE — Discharge Instructions (Signed)
You are having allergic reaction to something. I suspect it is something you ate rather than the watchband. We gave you a shot today to help with hives and itching. Take prednisone daily for 4 days, starting tomorrow. Take cetirizine daily for the next week. Please fill the prescription for the EpiPen. If you develop this rash again with any sensation of throat closing or difficulty breathing, use the EpiPen right away. Follow-up with your primary care doctor in the next week to let him know what's going on.

## 2015-12-09 NOTE — ED Notes (Signed)
Here with possible allergic reaction after eating nuts and wearing new silicone watch band. States it could be either between the two. Hives/whelps all over Took Benadryl with some relief

## 2015-12-09 NOTE — ED Provider Notes (Signed)
CSN: [id number]     Arrival date & time 12/09/15  1758 History   First MD Initiated Contact with Patient 12/09/15 1836     Chief Complaint  Patient presents with  . Allergic Reaction   (Consider location/radiation/quality/duration/timing/severity/associated sxs/prior Treatment) HPI  She is a 54 year old woman here for evaluation of allergic reaction. She states that yesterday she developed an itchy rash all over her body. She states it showed up about 3 hours after eating I pop. She also reports consuming a large amount of nuts as well as a new silicone watchband recently. She denies any difficulty breathing or swallowing. No sensation of throat closing. No wheezing. She has taken some Benadryl with some improvement.  Past Medical History  Diagnosis Date  . Hypertension    History reviewed. No pertinent past surgical history. No family history on file. Social History  Substance Use Topics  . Smoking status: Never Smoker   . Smokeless tobacco: None  . Alcohol Use: No   OB History    No data available     Review of Systems As in history of present illness Allergies  Review of patient's allergies indicates no known allergies.  Home Medications   Prior to Admission medications   Medication Sig Start Date End Date Taking? Authorizing Provider  cephALEXin (KEFLEX) 500 MG capsule Take 1 capsule (500 mg total) by mouth 4 (four) times daily. 03/09/15   Melony Overly, MD  cetirizine (ZYRTEC) 10 MG tablet Take 1 tablet (10 mg total) by mouth daily. For 1 week 12/09/15   Melony Overly, MD  EPINEPHrine 0.3 mg/0.3 mL IJ SOAJ injection Inject 0.3 mLs (0.3 mg total) into the muscle once. 12/09/15   Melony Overly, MD  hydrochlorothiazide (HYDRODIURIL) 25 MG tablet Take 1 tablet (25 mg total) by mouth daily. 06/04/14   Gregor Hams, MD  omeprazole (PRILOSEC) 40 MG capsule Take 1 capsule (40 mg total) by mouth daily. 06/04/14   Gregor Hams, MD  predniSONE (DELTASONE) 50 MG tablet Take 1 pill daily  for 4 days. 12/09/15   Melony Overly, MD  sucralfate (CARAFATE) 1 G tablet Take 1 tablet (1 g total) by mouth 4 (four) times daily -  with meals and at bedtime. 06/04/14   Gregor Hams, MD   Meds Ordered and Administered this Visit   Medications  methylPREDNISolone sodium succinate (SOLU-MEDROL) 125 mg/2 mL injection 125 mg (not administered)    BP 196/113 mmHg  Pulse 90  Temp(Src) 98.4 F (36.9 C) (Oral)  Resp 17  SpO2 99% No data found.   Physical Exam  Constitutional: She is oriented to person, place, and time. She appears well-developed and well-nourished. No distress.  Cardiovascular: Normal rate.   Pulmonary/Chest: Effort normal.  Neurological: She is alert and oriented to person, place, and time.  Skin: Rash (urticarial rash on back, chest, and arms) noted.    ED Course  Procedures (including critical care time)  Labs Review Labs Reviewed - No data to display  Imaging Review No results found.    MDM   1. Allergic reaction, initial encounter    Solu-Medrol given here. Discharge home with 4 days of prednisone, and cetirizine. Prescription given for EpiPen and discussed reasons to use it. Follow-up with PCP in the next week. Her blood pressure is elevated, but she has not taken her blood pressure medicine today. I strongly encouraged her to take that when she gets home.    Melony Overly, MD 12/09/15  1920 

## 2016-01-12 ENCOUNTER — Ambulatory Visit (INDEPENDENT_AMBULATORY_CARE_PROVIDER_SITE_OTHER): Payer: 59 | Admitting: Allergy and Immunology

## 2016-01-12 ENCOUNTER — Encounter: Payer: Self-pay | Admitting: Allergy and Immunology

## 2016-01-12 VITALS — BP 182/112 | HR 60 | Temp 97.7°F | Resp 16 | Ht 62.99 in | Wt 178.6 lb

## 2016-01-12 DIAGNOSIS — Z91018 Allergy to other foods: Secondary | ICD-10-CM | POA: Diagnosis not present

## 2016-01-12 DIAGNOSIS — I10 Essential (primary) hypertension: Secondary | ICD-10-CM

## 2016-01-12 DIAGNOSIS — T7840XA Allergy, unspecified, initial encounter: Secondary | ICD-10-CM | POA: Diagnosis not present

## 2016-01-12 NOTE — Progress Notes (Signed)
[facility]    Dear Dr. Montez Morita,  Thank you for referring Laura Oneal to the [facility] on 01/12/2016.   Below is a summation of this patient's evaluation and recommendations.  Thank you for your referral. I will keep you informed about this patient's response to treatment.   If you have any questions please to do hestitate to contact me.   Sincerely,  Jiles Prows, MD [facility]   ______________________________________________________________________    NEW PATIENT NOTE  Referring Provider: Wallene Huh, MD Primary Provider: Patricia Nettle, MD Date of office visit: 01/12/2016    Subjective:   Chief Complaint:  Laura Oneal is a 55 y.o. female with a chief complaint of Allergic Reaction  who presents to the clinic on 01/12/2016 with the following problems:  HPI Comments: Daylia presents this clinic in evaluation of an allergic reaction that occurred December 2016. Apparently over the course of 24 hours she developed urticaria across most of her body involved with periorbital swelling and facial swelling requiring urgent care evaluation and treatment with systemic steroids. After treatment with steroids her reaction was gone in approximately 2 days. She did not have any associated gastrointestinal or respiratory tract symptoms. She did not have any other associated systemic or constitutional symptoms. There was no obvious trigger giving rise to this reaction. However, she ate a large bag of peanuts and mixed nuts for the 3 days preceding this reaction and on the day of her reaction. She's not eaten any peanut or tree nuts since that point in time. She does not have a history of other atopic disease other than "sinusitis" twice a year that last less than one week per event.   Past Medical History  Diagnosis Date   . Hypertension   . Reflux   . Fibroids     UTERINE    Past Surgical History  Procedure Laterality Date  . Ablation      UTERUS    Current Outpatient Prescriptions on File Prior to Visit  Medication Sig Dispense Refill  . EPINEPHrine 0.3 mg/0.3 mL IJ SOAJ injection Inject 0.3 mLs (0.3 mg total) into the muscle once. 1 Device 0  . omeprazole (PRILOSEC) 40 MG capsule Take 1 capsule (40 mg total) by mouth daily. 30 capsule 1   No current facility-administered medications on file prior to visit.    No Known Allergies  Review of systems negative except as noted in HPI / PMHx or noted below:  Review of Systems  Constitutional: Negative.   HENT: Negative.   Eyes: Negative.   Respiratory: Negative.   Cardiovascular: Negative.   Gastrointestinal: Negative.        GERD treated with omeprazole  Genitourinary: Negative.   Musculoskeletal: Negative.   Skin: Negative.   Neurological: Negative.   Endo/Heme/Allergies: Negative.   Psychiatric/Behavioral: Negative.     Family History  Problem Relation Age of Onset  . Hypertension Mother   . Hypertension Father   . Heart Problems Maternal Grandmother   . Diabetes Paternal Grandmother     Social History   Social History  . Marital Status: Married    Spouse Name: N/A  . Number of Children: N/A  . Years of Education: N/A   Occupational History  . Not on file.   Social History Main Topics  . Smoking status: Never Smoker   . Smokeless  tobacco: Not on file  . Alcohol Use: No  . Drug Use: No  . Sexual Activity: Yes   Other Topics Concern  . Not on file   Social History Narrative    Environmental and Social history  Lives in a house with a dry environment, no animals located inside the household, carpeting in the bedroom, no plastic on the bed or pillow, and no smokers located inside the household. She works in an office setting.   Objective:   Filed Vitals:   01/12/16 0837  BP: 182/112  Pulse: 60  Temp: 97.7  F (36.5 C)  Resp: 16   Height: 5' 2.99" (160 cm) Weight: 178 lb 9.2 oz (81 kg)  Physical Exam  Constitutional: She is well-developed, well-nourished, and in no distress.  HENT:  Head: Normocephalic.  Right Ear: Tympanic membrane, external ear and ear canal normal.  Left Ear: Tympanic membrane, external ear and ear canal normal.  Nose: Nose normal. No mucosal edema or rhinorrhea.  Mouth/Throat: Uvula is midline, oropharynx is clear and moist and mucous membranes are normal. No oropharyngeal exudate.  Eyes: Conjunctivae are normal.  Neck: Trachea normal. No tracheal tenderness present. No tracheal deviation present. No thyromegaly present.  Cardiovascular: Normal rate, regular rhythm, S1 normal, S2 normal and normal heart sounds.   No murmur heard. Pulmonary/Chest: Breath sounds normal. No stridor. No respiratory distress. She has no wheezes. She has no rales.  Musculoskeletal: She exhibits no edema.  Lymphadenopathy:       Head (right side): No tonsillar adenopathy present.       Head (left side): No tonsillar adenopathy present.    She has no cervical adenopathy.    She has no axillary adenopathy.  Neurological: She is alert. Gait normal.  Skin: No rash noted. She is not diaphoretic. No erythema. Nails show no clubbing.  Psychiatric: Mood and affect normal.    Diagnostics: Allergy skin tests were performed. She demonstrated hypersensitivity to [place] nut  Assessment and Plan:    1. Food allergy   2. Allergic reaction, initial encounter   3. Essential hypertension     1.  Allergen avoidance measures?  2. EpiPen, Benadryl, M.D./ER for severe allergic reaction  3. Food challenge? - Check peanut component and nut panel  4. Further evaluation?  5. Have blood pressure issue addressed by primary care doctor  Kellina had some form of immunologic hyperreactivity of unknown etiologic factor but possibly secondary to the consumption of [place] nut.. We will work through the  possibility of a peanut / tree nut reactivity by having her collect blood tests looking for antigen specific IgE antibodies and then arrange for an in clinic food challenge. Certainly if she has recurrent reactions in the future she will require further evaluation and treatment.  Jiles Prows, MD [facility]

## 2016-01-12 NOTE — Patient Instructions (Addendum)
  1.  Allergen avoidance measures?  2. EpiPen, Benadryl, M.D./ER for severe allergic reaction  3. Food challenge? - Check peanut component and nut panel.  4. Further evaluation?  5. Have blood pressure issue addressed by primary care doctor

## 2016-01-15 LAB — IGE PEANUT COMPONENT PROFILE
F423-IgE Ara h 2: <0.1 kU/L
F424-IgE Ara h 3: <0.1 kU/L

## 2016-01-15 LAB — ALLERGENS(7)
BRAZIL NUT IGE: 0.29 kU/L — AB
F020-IGE ALMOND: 0.19 kU/L — AB
F202-IgE Cashew Nut: 0.21 kU/L — AB
HAZELNUT (FILBERT) IGE: 0.24 kU/L — AB
PEANUT IGE: 0.3 kU/L — AB
Walnut IgE: 0.2 kU/L — AB

## 2017-07-01 ENCOUNTER — Ambulatory Visit (HOSPITAL_COMMUNITY)
Admission: EM | Admit: 2017-07-01 | Discharge: 2017-07-01 | Disposition: A | Discharge status: Home / Self Care | Payer: 59 | Attending: Family Medicine | Admitting: Family Medicine

## 2017-07-01 ENCOUNTER — Encounter (HOSPITAL_COMMUNITY): Payer: Self-pay | Admitting: Emergency Medicine

## 2017-07-01 DIAGNOSIS — R142 Eructation: Secondary | ICD-10-CM | POA: Diagnosis not present

## 2017-07-01 DIAGNOSIS — L989 Disorder of the skin and subcutaneous tissue, unspecified: Secondary | ICD-10-CM

## 2017-07-01 MED ORDER — KETOCONAZOLE 2 % EX CREA: 1.0000 "application " | TOPICAL_CREAM | Freq: Every day | CUTANEOUS | 0 refills | Status: AC

## 2017-07-01 NOTE — ED Triage Notes (Addendum)
Patient reports abdominal pain associated with belching, flatulence, indigestion. Symptoms for a week or a week and a half Patient says there is a burning pain or warmth to naval area when touched

## 2017-07-01 NOTE — Discharge Instructions (Signed)
Make sure to follow up with your PCP in 2 weeks if skin issue is not improving. If it gets worse, seek care sooner.  Stop chewing gum, drinking carbonated beverages, gulping liquids, and drinking alcohol to help with belching.  These foods may cause you to belch more: Wheat, barley, rye, onion, leek, white part of spring onion, garlic, shallots, artichokes, beetroot, fennel, peas, chicory, pistachio, cashews, legumes, lentils, and chickpeas; Milk, custard, ice cream, and yogurt; Apples, pears, mangoes, cherries, watermelon, asparagus, sugar snap peas, honey, high-fructose corn syrup; Apricots, nectarines, peaches, plums, mushrooms, cauliflower, artificially sweetened chewing gum and confectionery  Give this diet a try for around 1 week. If you do not notice any changes, try taking omeprazole (Prilosec) again.  Don't google search vague symptoms in the future.

## 2017-07-01 NOTE — ED Provider Notes (Signed)
[facility]    CSN: [id number] Arrival date & time: 07/01/17  1802     History   Chief Complaint Chief Complaint  Patient presents with  . Abdominal Pain    HPI Laura Oneal is a 56 y.o. female.   HPI  Over the past week and half, the patient has had belching and feeling of fullness in addition to burning pain over the navel area. She has a history of reflux. She has been on Prilosec in the past, however is not taking anything right now. She is not regurgitating or having strange tastes in her mouth. She does feel a globus sensation intermittently in her throat. There is no burning pain in her chest or epigastric region. Just prior to this starting, she was at a cook out and there was lots of soda leftover which she did drink. Denies fevers, bleeding, unintentional weight loss, or nighttime awakenings.  Past Medical History:  Diagnosis Date  . Fibroids    UTERINE  . Hypertension   . Reflux     Past Surgical History:  Procedure Laterality Date  . ABLATION     UTERUS    Home Medications    Prior to Admission medications   Medication Sig Start Date End Date Taking? Authorizing Provider  EPINEPHrine 0.3 mg/0.3 mL IJ SOAJ injection Inject 0.3 mLs (0.3 mg total) into the muscle once. 12/09/15   Melony Overly, MD  ketoconazole (NIZORAL) 2 % cream Apply 1 application topically daily. 07/01/17   Shelda Pal, DO  omeprazole (PRILOSEC) 40 MG capsule Take 1 capsule (40 mg total) by mouth daily. 06/04/14   Gregor Hams, MD  valsartan-hydrochlorothiazide (DIOVAN-HCT) 320-12.5 MG tablet Take 1 tablet by mouth daily.    [provider]    Family History Family History  Problem Relation Age of Onset  . Heart Problems Maternal Grandmother   . Diabetes Paternal Grandmother   . Hypertension Mother   . Hypertension Father     Social History Social History  Substance Use Topics  . Smoking status: Never Smoker  . Smokeless tobacco: Not on file    . Alcohol use No     Allergies   Patient has no known allergies.   Review of Systems Review of Systems  Constitutional: Negative for fever.  Gastrointestinal: Negative for diarrhea, nausea and vomiting.     Physical Exam Triage Vital Signs ED Triage Vitals [07/01/17 1828]  Enc Vitals Group     BP (!) 141/86     Pulse Rate (!) 102     Resp 18     Temp 98.2 F (36.8 C)     Temp Source Oral     SpO2 100 %   Updated Vital Signs BP (!) 141/86 (BP Location: Left Arm)   Pulse (!) 102   Temp 98.2 F (36.8 C) (Oral)   Resp 18   SpO2 100%   Physical Exam  Constitutional: She appears well-developed and well-nourished.  HENT:  Head: Normocephalic and atraumatic.  Mouth/Throat: Oropharynx is clear and moist.  Eyes: Pupils are equal, round, and reactive to light. EOM are normal.  Cardiovascular: Normal rate and regular rhythm.   No murmur heard. Pulmonary/Chest: Effort normal and breath sounds normal. No respiratory distress.  Abdominal: Soft. Bowel sounds are normal. She exhibits no distension. There is no tenderness.  Skin: Skin is warm and dry.  Patches of hyperpigmentation near umbilicus, no scaling, erythema, drainage, fluctuance  Psychiatric: She has a normal mood and  affect. Judgment normal.     UC Treatments / Results  Procedures Procedures none  Initial Impression / Assessment and Plan / UC Course  I have reviewed the triage vital signs and the nursing notes.  Pertinent labs & imaging results that were available during my care of the patient were reviewed by me and considered in my medical decision making (see chart for details).     Patient presents with seemingly 2 different issues. The first seems to be more related to gas that is diet related. I gave her a diet handout regarding eructation. She is to try this and if no help after 1-2 weeks, and return to the Prilosec for no more than 2 months. I don't think it is related to reflux, however she did state  that there is a globus sensation in her throat every once in a while. Everything else in her history suggests eructation. There are no red flag signs/symptoms in her history or physical examination. Regarding the skin on her abdomen, I will start by treating her for a fungal infection. I would like her to follow-up with her PCP in 2 weeks to recheck. If it is not any better, would reconsider diagnosis and workup. She is to be discharged in stable condition. The patient and her husband voiced understanding and agreement to the plan.  Final Clinical Impressions(s) / UC Diagnoses   Final diagnoses:  Eructation  Skin lesion    New Prescriptions Discharge Medication List as of 07/01/2017  6:59 PM    START taking these medications   Details  ketoconazole (NIZORAL) 2 % cream Apply 1 application topically daily., Starting Sat 07/01/2017, Normal         Shelda Pal, [place] 07/01/17 2105

## 2020-03-23 ENCOUNTER — Ambulatory Visit: Payer: Self-pay | Attending: Internal Medicine

## 2020-03-23 DIAGNOSIS — Z23 Encounter for immunization: Secondary | ICD-10-CM

## 2020-03-23 NOTE — Progress Notes (Signed)
   Covid-19 Vaccination Clinic  Name:  Laura Oneal    MRN: [id number] DOB: [birth year]  03/23/2020  Laura Oneal was observed post Covid-19 immunization for 15 minutes without incident. She was provided with Vaccine Information Sheet and instruction to access the V-Safe system.   Laura Oneal was instructed to call 911 with any severe reactions post vaccine: Marland Kitchen Difficulty breathing  . Swelling of face and throat  . A fast heartbeat  . A bad rash all over body  . Dizziness and weakness   Immunizations Administered    Name Date Dose VIS Date Route   Pfizer COVID-19 Vaccine 03/23/2020 12:46 PM 0.3 mL 11/22/2019 Intramuscular   Manufacturer: [place]   Lot: [id number]   [place]: [id number]

## 2020-04-13 ENCOUNTER — Ambulatory Visit: Payer: Self-pay | Attending: Internal Medicine

## 2020-04-13 DIAGNOSIS — Z23 Encounter for immunization: Secondary | ICD-10-CM

## 2020-04-13 NOTE — Progress Notes (Signed)
   Covid-19 Vaccination Clinic  Name:  Laura Oneal    MRN: [id number] DOB: 1961-12-01  04/13/2020  [name] was observed post Covid-19 immunization for 15 minutes without incident. She was provided with Vaccine Information Sheet and instruction to access the V-Safe system.   [name] was instructed to call 911 with any severe reactions post vaccine: Marland Kitchen Difficulty breathing  . Swelling of face and throat  . A fast heartbeat  . A bad rash all over body  . Dizziness and weakness   Immunizations Administered    Name Date Dose VIS Date Route   Pfizer COVID-19 Vaccine 04/13/2020 12:58 PM 0.3 mL 02/05/2019 Intramuscular   Manufacturer: [place]   Lot: [id number]   [place]: [id number]

## 2022-01-28 ENCOUNTER — Ambulatory Visit (HOSPITAL_COMMUNITY)
Admission: EM | Admit: 2022-01-28 | Discharge: 2022-01-28 | Disposition: A | Discharge status: Home / Self Care | Payer: Self-pay | Attending: Family Medicine | Admitting: Family Medicine

## 2022-01-28 ENCOUNTER — Encounter (HOSPITAL_COMMUNITY): Payer: Self-pay

## 2022-01-28 DIAGNOSIS — R1310 Dysphagia, unspecified: Secondary | ICD-10-CM

## 2022-01-28 DIAGNOSIS — K5900 Constipation, unspecified: Secondary | ICD-10-CM

## 2022-01-28 DIAGNOSIS — K219 Gastro-esophageal reflux disease without esophagitis: Secondary | ICD-10-CM

## 2022-01-28 MED ORDER — OMEPRAZOLE 40 MG PO CPDR: 40.0000 mg | DELAYED_RELEASE_CAPSULE | Freq: Every day | ORAL | 1 refills | Status: AC

## 2022-01-28 MED ORDER — OMEPRAZOLE 40 MG PO CPDR: 40.0000 mg | DELAYED_RELEASE_CAPSULE | Freq: Every day | ORAL | 1 refills | Status: DC

## 2022-01-28 NOTE — ED Provider Notes (Signed)
[place]    CSN: [id number] Arrival date & time: 01/28/22  1716      History   Chief Complaint Chief Complaint  Patient presents with   Gastroesophageal Reflux    HPI Laura Oneal is a 61 y.o. female.    Gastroesophageal Reflux  Here with worsened epigastric bloating, heartburn, and burping.  She does sometimes feel like food hangs up but she is swallowing it.  She has not had to vomit it back up.  She has not had any vomiting or diarrhea.  No fever.  She actually has been more constipated in the last few days but she did have a little bit of a bowel movement this morning.  No blood in her stool  She takes Diovan HCT for hypertension, and sees cardiology.  She does not have a primary doctor. She does not smoke    Past Medical History:  Diagnosis Date   Fibroids    UTERINE   Hypertension    Reflux     There are no problems to display for this patient.   Past Surgical History:  Procedure Laterality Date   ABLATION     UTERUS    OB History   No obstetric history on file.      Home Medications    Prior to Admission medications   Medication Sig Start Date End Date Taking? Authorizing Provider  EPINEPHrine 0.3 mg/0.3 mL IJ SOAJ injection Inject 0.3 mLs (0.3 mg total) into the muscle once. 12/09/15   Melony Overly, MD  ketoconazole (NIZORAL) 2 % cream Apply 1 application topically daily. 07/01/17   Shelda Pal, DO  omeprazole (PRILOSEC) 40 MG capsule Take 1 capsule (40 mg total) by mouth daily. 01/28/22   Barrett Henle, MD  valsartan-hydrochlorothiazide (DIOVAN-HCT) 320-12.5 MG tablet Take 1 tablet by mouth daily.    [provider]    Family History Family History  Problem Relation Age of Onset   Heart Problems Maternal Grandmother    Diabetes Paternal Grandmother    Hypertension Mother    Hypertension Father     Social History Social History   Tobacco Use   Smoking status: Never  Substance Use Topics    Alcohol use: No   Drug use: No     Allergies   Peanut-containing drug products   Review of Systems Review of Systems   Physical Exam Triage Vital Signs ED Triage Vitals  Enc Vitals Group     BP 01/28/22 1818 (!) 186/118     Pulse Rate 01/28/22 1818 89     Resp 01/28/22 1818 18     Temp 01/28/22 1818 97.6 F (36.4 C)     Temp Source 01/28/22 1818 Oral     SpO2 01/28/22 1818 95 %     Weight --      Height --      Head Circumference --      Peak Flow --      Pain Score 01/28/22 1821 1     Pain Loc --      Pain Edu? --      Excl. in [place]? --    No data found.  Updated Vital Signs BP (!) 186/118 (BP Location: Left Arm) Comment: has not taken blood pressure meds   Pulse 89    Temp 97.6 F (36.4 C) (Oral)    Resp 18    SpO2 95%   Visual Acuity Right Eye Distance:   Left Eye Distance:  Bilateral Distance:    Right Eye Near:   Left Eye Near:    Bilateral Near:     Physical Exam Vitals reviewed.  Constitutional:      General: She is not in acute distress.    Appearance: She is not toxic-appearing.  HENT:     Mouth/Throat:     Mouth: Mucous membranes are moist.     Pharynx: No oropharyngeal exudate or posterior oropharyngeal erythema.  Eyes:     Extraocular Movements: Extraocular movements intact.     Pupils: Pupils are equal, round, and reactive to light.  Cardiovascular:     Rate and Rhythm: Normal rate and regular rhythm.     Heart sounds: No murmur heard. Pulmonary:     Effort: Pulmonary effort is normal. No respiratory distress.     Breath sounds: No wheezing, rhonchi or rales.  Abdominal:     Palpations: Abdomen is soft. There is no mass.     Tenderness: There is abdominal tenderness (slight tenderness of epigastrium). There is no guarding.  Musculoskeletal:     Cervical back: Neck supple.  Lymphadenopathy:     Cervical: No cervical adenopathy.  Skin:    Capillary Refill: Capillary refill takes less than 2 seconds.     Coloration: Skin is not  jaundiced or pale.  Neurological:     General: No focal deficit present.     Mental Status: She is alert and oriented to person, place, and time.  Psychiatric:        Behavior: Behavior normal.     UC Treatments / Results  Labs (all labs ordered are listed, but only abnormal results are displayed) Labs Reviewed - No data to display  EKG   Radiology No results found.  Procedures Procedures (including critical care time)  Medications Ordered in UC Medications - No data to display  Initial Impression / Assessment and Plan / UC Course  I have reviewed the triage vital signs and the nursing notes.  Pertinent labs & imaging results that were available during my care of the patient were reviewed by me and considered in my medical decision making (see chart for details).     We will treat with daily omeprazole for probable reflux.  I am concerned that she is having some dysphagia, and needs to see a gastroenterologist eventually.  Request will be made to help her find a PCP.  We did briefly discuss diet that can help her reflux improve Final Clinical Impressions(s) / UC Diagnoses   Final diagnoses:  Gastroesophageal reflux disease without esophagitis  Dysphagia, unspecified type  Constipation, unspecified constipation type     Discharge Instructions      Take omeprazole 40 mg, 1 daily  Take MiraLAX over-the-counter according to the instructions on the bottle.  1 capful daily dissolved in liquid  Avoid eating supper too close to bedtime     ED Prescriptions     Medication Sig Dispense Auth. Provider   omeprazole (PRILOSEC) 40 MG capsule Take 1 capsule (40 mg total) by mouth daily. 30 capsule Barrett Henle, MD      PDMP not reviewed this encounter.   Barrett Henle, MD 01/28/22 [id number]

## 2022-01-28 NOTE — Discharge Instructions (Addendum)
Take omeprazole 40 mg, 1 daily  Take MiraLAX over-the-counter according to the instructions on the bottle.  1 capful daily dissolved in liquid  Avoid eating supper too close to bedtime

## 2022-01-28 NOTE — ED Triage Notes (Signed)
Pt presents with  chronic epigastric bloating and feeling like her food is stuck.

## 2022-02-08 ENCOUNTER — Encounter: Payer: Self-pay | Admitting: *Deleted

## 2022-07-09 ENCOUNTER — Encounter (HOSPITAL_COMMUNITY): Payer: Self-pay

## 2022-07-09 ENCOUNTER — Ambulatory Visit (HOSPITAL_COMMUNITY)
Admission: EM | Admit: 2022-07-09 | Discharge: 2022-07-09 | Disposition: A | Discharge status: Home / Self Care | Payer: 59 | Attending: Emergency Medicine | Admitting: Emergency Medicine

## 2022-07-09 DIAGNOSIS — N390 Urinary tract infection, site not specified: Secondary | ICD-10-CM | POA: Diagnosis present

## 2022-07-09 LAB — POCT URINALYSIS DIPSTICK, ED / UC
Bilirubin Urine: NEGATIVE
Glucose, UA: NEGATIVE mg/dL
Nitrite: POSITIVE — AB
Protein, ur: 100 mg/dL — AB
Specific Gravity, Urine: 1.02 (ref 1.005–1.030)
Urobilinogen, UA: 0.2 mg/dL (ref 0.0–1.0)
pH: 5.5 (ref 5.0–8.0)

## 2022-07-09 MED ORDER — CEPHALEXIN 500 MG PO CAPS: 500.0000 mg | ORAL_CAPSULE | Freq: Two times a day (BID) | ORAL | 0 refills | Status: AC

## 2022-07-09 NOTE — ED Triage Notes (Signed)
Pt states foul smelling urine first thing in the morning for the past 2 weeks

## 2022-07-09 NOTE — ED Provider Notes (Signed)
[place]    CSN: [id number] Arrival date & time: 07/09/22  1751     History   Chief Complaint Chief Complaint  Patient presents with   Dysuria    HPI Laura Oneal is a 61 y.o. female.  Presents with 2-week history of foul-smelling urine and dysuria. Very mild dysuria but still causes discomfort. Denies any urgency, frequency, hematuria, back or flank pain, fevers. No abdominal pain, vomiting/diarrhea.  Has not taken any medicines.  Past Medical History:  Diagnosis Date   Fibroids    UTERINE   Hypertension    Reflux     There are no problems to display for this patient.   Past Surgical History:  Procedure Laterality Date   ABLATION     UTERUS    OB History   No obstetric history on file.      Home Medications    Prior to Admission medications   Medication Sig Start Date End Date Taking? Authorizing Provider  cephALEXin (KEFLEX) 500 MG capsule Take 1 capsule (500 mg total) by mouth 2 (two) times daily for 5 days. 07/09/22 07/14/22 Yes Tiphanie Vo, Wells Guiles, PA-C  EPINEPHrine 0.3 mg/0.3 mL IJ SOAJ injection Inject 0.3 mLs (0.3 mg total) into the muscle once. 12/09/15   Melony Overly, MD  ketoconazole (NIZORAL) 2 % cream Apply 1 application topically daily. 07/01/17   Shelda Pal, DO  omeprazole (PRILOSEC) 40 MG capsule Take 1 capsule (40 mg total) by mouth daily. 01/28/22   Barrett Henle, MD  valsartan-hydrochlorothiazide (DIOVAN-HCT) 320-12.5 MG tablet Take 1 tablet by mouth daily.    [provider]    Family History Family History  Problem Relation Age of Onset   Heart Problems Maternal Grandmother    Diabetes Paternal Grandmother    Hypertension Mother    Hypertension Father     Social History Social History   Tobacco Use   Smoking status: Never  Substance Use Topics   Alcohol use: No   Drug use: No     Allergies   Peanut-containing drug products   Review of Systems Review of Systems  Genitourinary:   Positive for dysuria.   Per HPI  Physical Exam Triage Vital Signs ED Triage Vitals [07/09/22 1809]  Enc Vitals Group     BP (!) 161/96     Pulse Rate 93     Resp 16     Temp 97.9 F (36.6 C)     Temp Source Oral     SpO2 95 %     Weight      Height      Head Circumference      Peak Flow      Pain Score 0     Pain Loc      Pain Edu?      Excl. in [place]?    No data found.  Updated Vital Signs BP (!) 161/96 (BP Location: Left Arm)   Pulse 93   Temp 97.9 F (36.6 C) (Oral)   Resp 16   SpO2 95%   Physical Exam Vitals and nursing note reviewed.  Constitutional:      General: She is not in acute distress.    Comments: Very pleasant 62 year old female  Eyes:     Conjunctiva/sclera: Conjunctivae normal.  Cardiovascular:     Rate and Rhythm: Normal rate and regular rhythm.     Pulses: Normal pulses.     Heart sounds: Normal heart sounds.  Pulmonary:  Effort: Pulmonary effort is normal.     Breath sounds: Normal breath sounds.  Abdominal:     Palpations: Abdomen is soft.     Tenderness: There is no abdominal tenderness. There is no right CVA tenderness or left CVA tenderness.  Genitourinary:    Comments: Deferred Skin:    General: Skin is warm and dry.  Neurological:     Mental Status: She is alert and oriented to person, place, and time.      UC Treatments / Results  Labs (all labs ordered are listed, but only abnormal results are displayed) Labs Reviewed  POCT URINALYSIS DIPSTICK, ED / UC - Abnormal; Notable for the following components:      Result Value   Ketones, ur TRACE (*)    Hgb urine dipstick TRACE (*)    Protein, ur 100 (*)    Nitrite POSITIVE (*)    Leukocytes,Ua SMALL (*)    All other components within normal limits  URINE CULTURE    EKG   Radiology No results found.  Procedures Procedures   Medications Ordered in UC Medications - No data to display  Initial Impression / Assessment and Plan / UC Course  I have reviewed the  triage vital signs and the nursing notes.  Pertinent labs & imaging results that were available during my care of the patient were reviewed by me and considered in my medical decision making (see chart for details).  Urinalysis with positive nitrate, small leuks, trace ketones and hemoglobin.  No CVA tenderness, patient is well-appearing. Treat with Keflex twice daily for 5 days.  Patient can return if symptoms do not improve with the medicine.  Any worsening symptoms she will go to the emergency department.  Also increasing water intake.  Patient agrees to plan.  Final Clinical Impressions(s) / UC Diagnoses   Final diagnoses:  Urinary tract infection without hematuria, site unspecified     Discharge Instructions      Please take the medication as prescribed.  I recommend to take it with food.  Increase your water intake to at least 64 ounces daily.  Please return to the urgent care if symptoms do not improve with treatment. Any worsening symptoms, please go to the emergency department.     ED Prescriptions     Medication Sig Dispense Auth. Provider   cephALEXin (KEFLEX) 500 MG capsule Take 1 capsule (500 mg total) by mouth 2 (two) times daily for 5 days. 10 capsule Kaydon Husby, Wells Guiles, PA-C      PDMP not reviewed this encounter.   Kyra Leyland 07/09/22 1844

## 2022-07-09 NOTE — Discharge Instructions (Signed)
Please take the medication as prescribed.  I recommend to take it with food.  Increase your water intake to at least 64 ounces daily.  Please return to the urgent care if symptoms do not improve with treatment. Any worsening symptoms, please go to the emergency department.

## 2022-07-11 LAB — URINE CULTURE: Culture: >=100000 — AB

## 2024-06-28 ENCOUNTER — Emergency Department (HOSPITAL_BASED_OUTPATIENT_CLINIC_OR_DEPARTMENT_OTHER)
Admission: EM | Admit: 2024-06-28 | Discharge: 2024-06-28 | Disposition: A | Discharge status: Home / Self Care | Payer: Self-pay | Attending: Emergency Medicine | Admitting: Emergency Medicine

## 2024-06-28 ENCOUNTER — Other Ambulatory Visit: Payer: Self-pay

## 2024-06-28 ENCOUNTER — Encounter (HOSPITAL_BASED_OUTPATIENT_CLINIC_OR_DEPARTMENT_OTHER): Payer: Self-pay | Admitting: Emergency Medicine

## 2024-06-28 DIAGNOSIS — R21 Rash and other nonspecific skin eruption: Secondary | ICD-10-CM | POA: Insufficient documentation

## 2024-06-28 MED ORDER — BACITRACIN ZINC 500 UNIT/GM EX OINT: 1.0000 | TOPICAL_OINTMENT | Freq: Two times a day (BID) | CUTANEOUS | 0 refills | Status: AC

## 2024-06-28 NOTE — ED Notes (Signed)
 AVS provided by edp was reviewed with pt. Pt was bale to verbalize understanding of instructions. Has no additional questions at this time. Pharmacy verified with pt.

## 2024-06-28 NOTE — ED Provider Notes (Signed)
  [place] EMERGENCY DEPARTMENT AT [facility] Provider Note   CSN: [id number] Arrival date & time: 06/28/24  2052     Patient presents with: Rash   Laura Oneal is a 63 y.o. female.   Patient with itchiness around the left upper thigh from possible bug bite.  Nothing makes it worse or better.  Denies any fevers or chills.  No significant medical problems.  The history is provided by the patient.       Prior to Admission medications   Medication Sig Start Date End Date Taking? Authorizing Provider  bacitracin ointment Apply 1 Application topically 2 (two) times daily. 06/28/24  Yes Philana Younis, DO  EPINEPHrine  0.3 mg/0.3 mL IJ SOAJ injection Inject 0.3 mLs (0.3 mg total) into the muscle once. 12/09/15   Diedra Rocky PARAS, MD  ketoconazole  (NIZORAL ) 2 % cream Apply 1 application topically daily. 07/01/17   Frann Mabel Mt, DO  omeprazole  (PRILOSEC) 40 MG capsule Take 1 capsule (40 mg total) by mouth daily. 01/28/22   Vonna Sharlet POUR, MD  valsartan-hydrochlorothiazide  (DIOVAN-HCT) 320-12.5 MG tablet Take 1 tablet by mouth daily.    [provider]    Allergies: Peanut -containing drug products    Review of Systems  Updated Vital Signs BP (!) 179/89 (BP Location: Right Arm)   Pulse 74   Temp 98 F (36.7 C) (Oral)   Resp 13   SpO2 99%   Physical Exam Cardiovascular:     Pulses: Normal pulses.  Skin:    General: Skin is warm.     Comments: Small area of dry skin, dime size cellulitis area may be  Neurological:     General: No focal deficit present.     Mental Status: She is alert.     (all labs ordered are listed, but only abnormal results are displayed) Labs Reviewed - No data to display  EKG: None  Radiology: No results found.   Procedures   Medications Ordered in the ED - No data to display                                  Medical Decision Making Risk OTC drugs.   Laura Oneal is here with rash.  She has a dime  sized area of may be cellulitis or irritation.  I have no concern for emergent process.  Will have her do bacitracin ointment twice daily and follow-up with primary care doctor.  Have no concern for deep infectious process.  This is overall very superficial and small area of irritation of the skin.  Likely from a bug bite.  This chart was dictated using voice recognition software.  Despite best efforts to proofread,  errors can occur which can change the documentation meaning.      Final diagnoses:  Rash    ED Discharge Orders          Ordered    bacitracin ointment  2 times daily        06/28/24 2107               Ruthe Cornet, DO 06/28/24 2108

## 2024-06-28 NOTE — ED Triage Notes (Signed)
 Pt reports concern for itchy round red rash to left upper thigh she first noticed on Tuesday. No fevers. No chills.

## 2024-06-28 NOTE — Discharge Instructions (Signed)
 Use the ointment twice daily for about 5 or 7 days.  Follow-up with your primary care doctor.

## 2024-07-03 ENCOUNTER — Emergency Department (HOSPITAL_BASED_OUTPATIENT_CLINIC_OR_DEPARTMENT_OTHER)
Admission: EM | Admit: 2024-07-03 | Discharge: 2024-07-03 | Disposition: A | Discharge status: Home / Self Care | Payer: Self-pay | Attending: Emergency Medicine | Admitting: Emergency Medicine

## 2024-07-03 ENCOUNTER — Other Ambulatory Visit: Payer: Self-pay

## 2024-07-03 ENCOUNTER — Encounter (HOSPITAL_BASED_OUTPATIENT_CLINIC_OR_DEPARTMENT_OTHER): Payer: Self-pay

## 2024-07-03 DIAGNOSIS — Z91010 Allergy to peanuts: Secondary | ICD-10-CM | POA: Insufficient documentation

## 2024-07-03 DIAGNOSIS — B354 Tinea corporis: Secondary | ICD-10-CM | POA: Insufficient documentation

## 2024-07-03 MED ORDER — CLOTRIMAZOLE 1 % EX CREA: TOPICAL_CREAM | CUTANEOUS | 0 refills | Status: AC

## 2024-07-03 NOTE — ED Triage Notes (Signed)
 Arrives POV with complaints of increased round rash to left thigh x5 days. Seen here for the same recently and site has not improved with ointment. Pt is concerned that she may have been bit by a spider.

## 2024-07-03 NOTE — ED Provider Notes (Signed)
 [place] EMERGENCY DEPARTMENT AT [facility] Provider Note   CSN: [id number] Arrival date & time: 07/03/24  1453     Patient presents with: Skin Problem   Laura Oneal is a 63 y.o. female.   HPI   63 year old female presents emergency department with small rash to the left lateral thigh.  Was seen about 5 days ago, feels like this all started initially with a bug bite.  Was placed on bacitracin  without any relief.  Now the area is circular, raised, bigger than before, slightly itchy.  No other systemic symptoms.  Prior to Admission medications   Medication Sig Start Date End Date Taking? Authorizing Provider  clotrimazole  (LOTRIMIN ) 1 % cream Apply to affected area 2 times daily for four weeks 07/03/24  Yes Tiera Mensinger M, DO  bacitracin  ointment Apply 1 Application topically 2 (two) times daily. 06/28/24   Curatolo, Adam, DO  EPINEPHrine  0.3 mg/0.3 mL IJ SOAJ injection Inject 0.3 mLs (0.3 mg total) into the muscle once. 12/09/15   Diedra Rocky PARAS, MD  ketoconazole  (NIZORAL ) 2 % cream Apply 1 application topically daily. 07/01/17   Frann Mabel Mt, DO  omeprazole  (PRILOSEC) 40 MG capsule Take 1 capsule (40 mg total) by mouth daily. 01/28/22   Vonna Sharlet POUR, MD  valsartan-hydrochlorothiazide  (DIOVAN-HCT) 320-12.5 MG tablet Take 1 tablet by mouth daily.    [provider]    Allergies: Peanut -containing drug products    Review of Systems  Constitutional:  Negative for fever.  Respiratory:  Negative for shortness of breath.   Cardiovascular:  Negative for chest pain.  Gastrointestinal:  Negative for abdominal pain.  Skin:  Positive for rash.  Neurological:  Negative for headaches.    Updated Vital Signs BP (!) 172/98 (BP Location: Left Arm)   Pulse 87   Temp 97.8 F (36.6 C) (Temporal)   Resp 20   Ht 5' 2.5 (1.588 m)   Wt 83.9 kg   SpO2 100%   BMI 33.30 kg/m   Physical Exam Vitals and nursing note reviewed.  Constitutional:       Appearance: Normal appearance.  HENT:     Head: Normocephalic.     Mouth/Throat:     Mouth: Mucous membranes are moist.  Cardiovascular:     Rate and Rhythm: Normal rate.  Pulmonary:     Effort: Pulmonary effort is normal. No respiratory distress.  Skin:    General: Skin is warm.     Comments: Small quarter size area on left lateral thigh, raised scaled edges with central clearing. No other localized swelling or fluctuance.   Neurological:     Mental Status: She is alert and oriented to person, place, and time. Mental status is at baseline.  Psychiatric:        Mood and Affect: Mood normal.     (all labs ordered are listed, but only abnormal results are displayed) Labs Reviewed - No data to display  EKG: None  Radiology: No results found.   Procedures   Medications Ordered in the ED - No data to display                                  Medical Decision Making  63 year old female presents emergency department localized rash to the left thigh.  Initially thought to be secondary to a bug bite, treated with bacitracin  without improvement.  Area is now quarter sized, circular, raised edges, central clearing,  scaly, itchy.  Looks like classic tinea rash.  Will treat with topical antifungal and plan for outpatient follow-up.  Patient at this time appears safe and stable for discharge and close outpatient follow up. Discharge plan and strict return to ED precautions discussed, patient verbalizes understanding and agreement.     Final diagnoses:  Tinea corporis    ED Discharge Orders          Ordered    clotrimazole  (LOTRIMIN ) 1 % cream        07/03/24 1616               Dreshaun Stene, Roxie HERO, DO 07/03/24 1627

## 2024-07-03 NOTE — Discharge Instructions (Addendum)
 You have been seen and discharged from the emergency department.  You were diagnosed with a localized fungal infection.  Please use antifungal cream twice a day as prescribed.  Follow-up with your primary provider for further evaluation and further care. Take home medications as prescribed. If you have any worsening symptoms or further concerns for your health please return to an emergency department for further evaluation.

## 2024-09-17 ENCOUNTER — Encounter (HOSPITAL_BASED_OUTPATIENT_CLINIC_OR_DEPARTMENT_OTHER): Payer: Self-pay | Admitting: Emergency Medicine

## 2024-09-17 ENCOUNTER — Other Ambulatory Visit: Payer: Self-pay

## 2024-09-17 ENCOUNTER — Emergency Department (HOSPITAL_BASED_OUTPATIENT_CLINIC_OR_DEPARTMENT_OTHER)
Admission: EM | Admit: 2024-09-17 | Discharge: 2024-09-17 | Disposition: A | Discharge status: Home / Self Care | Payer: Self-pay | Attending: Emergency Medicine | Admitting: Emergency Medicine

## 2024-09-17 DIAGNOSIS — Z91010 Allergy to peanuts: Secondary | ICD-10-CM | POA: Insufficient documentation

## 2024-09-17 DIAGNOSIS — K640 First degree hemorrhoids: Secondary | ICD-10-CM | POA: Insufficient documentation

## 2024-09-17 DIAGNOSIS — L819 Disorder of pigmentation, unspecified: Secondary | ICD-10-CM | POA: Insufficient documentation

## 2024-09-17 NOTE — ED Notes (Signed)

## 2024-09-17 NOTE — ED Triage Notes (Signed)
 Pt requests LLE wound check. Also reports concern for hemorrhoid. Denies bleeding x 2 weeks

## 2024-09-17 NOTE — ED Provider Notes (Signed)
 [place] EMERGENCY DEPARTMENT AT [name] Provider Note   CSN: [id number] Arrival date & time: 09/17/24  1151     Patient presents with: Wound Check   Laura Oneal is a 63 y.o. female.   Patient is a 63 year old female who presents with 2 complaints.  1 she wanted an area on her skin check.  She was treated previously for ringworm to her left thigh.  This was back in July.  She said it seemed to have gone away but she still has a little bit of skin discoloration in the area and wanted to have it checked to make sure it was not still infected.  She denies any pain in the area.  No itching.  No fevers.  She also wanted to check and see if she had a hemorrhoid because she noticed a little bit of swelling around her rectal area.  She says she does not have any significant constipation but sometimes she has to strain a bit for a bowel movement.  No rectal pain.  No blood in her stool.       Prior to Admission medications   Medication Sig Start Date End Date Taking? Authorizing Provider  bacitracin  ointment Apply 1 Application topically 2 (two) times daily. 06/28/24   Curatolo, Adam, DO  clotrimazole  (LOTRIMIN ) 1 % cream Apply to affected area 2 times daily for four weeks 07/03/24   Horton, Kristie M, DO  EPINEPHrine  0.3 mg/0.3 mL IJ SOAJ injection Inject 0.3 mLs (0.3 mg total) into the muscle once. 12/09/15   Diedra Rocky PARAS, MD  ketoconazole  (NIZORAL ) 2 % cream Apply 1 application topically daily. 07/01/17   Frann Mabel Mt, DO  omeprazole  (PRILOSEC) 40 MG capsule Take 1 capsule (40 mg total) by mouth daily. 01/28/22   Vonna Sharlet POUR, MD  valsartan-hydrochlorothiazide  (DIOVAN-HCT) 320-12.5 MG tablet Take 1 tablet by mouth daily.    [provider]    Allergies: Peanut -containing drug products    Review of Systems  Constitutional:  Negative for fatigue and fever.  Respiratory:  Negative for shortness of breath.   Gastrointestinal:  Negative for abdominal  pain, blood in stool, rectal pain and vomiting.  Skin:  Positive for color change. Negative for rash and wound.    Updated Vital Signs BP (!) 148/77 (BP Location: Right Arm)   Pulse 81   Temp 97.9 F (36.6 C)   Resp 18   SpO2 99%   Physical Exam Constitutional:      Appearance: Normal appearance.  Cardiovascular:     Rate and Rhythm: Normal rate.  Pulmonary:     Effort: Pulmonary effort is normal.  Genitourinary:    Comments: With 2 techs present as chaperone's, her rectal area was examined.  There is a very small soft external hemorrhoid.  No thrombosis.  No bleeding.  Nontender.  No rectal redness. Skin:    General: Skin is warm and dry.     Comments: Has a 2 cm circular area of skin discoloration to her left mid thigh.  There is no induration or fluctuance.  There is some faint darkening of the skin as compared to the surrounding skin.  No rashes noted.  No erythema.  No vesicles.  No raised areas.  Neurological:     Mental Status: She is alert and oriented to person, place, and time.     (all labs ordered are listed, but only abnormal results are displayed) Labs Reviewed - No data to display  EKG: None  Radiology: No results found.   Procedures   Medications Ordered in the ED - No data to display                                  Medical Decision Making  Patient presents wanting to have the skin discoloration checked.  I think that is just some residual skin discoloration from the prior tinea corporis infection.  I do not see any current signs of infection.  Advised her that it may take a long time for it to go back to normal or may always be a little bit discolored.  Advised her she can have her doctor keep an eye on it.  She has a very small nonthrombosed hemorrhoid.  It is nontender.  Advised her to increase her fiber in her diet and gave her other hemorrhoid precautions.  Advised her there are some over-the-counter hemorrhoid preparations that she can use as  needed.     Final diagnoses:  Grade I hemorrhoids  Discoloration of skin    ED Discharge Orders     None          Lenor Hollering, MD 09/17/24 1430

## 2024-09-17 NOTE — ED Notes (Signed)
 Assisted Dr. Lenor with wound check.

## 2025-05-16 ENCOUNTER — Ambulatory Visit: Admitting: Family Medicine
